# Patient Record
Sex: Male | Born: 1985 | Hispanic: No | Marital: Married | Smoking: Current every day smoker
Health system: Southern US, Community
[De-identification: ages and names within clinical notes are randomized; demographics above are authoritative.]

---

## 2016-04-06 ENCOUNTER — Encounter (HOSPITAL_BASED_OUTPATIENT_CLINIC_OR_DEPARTMENT_OTHER): Payer: Self-pay

## 2016-04-06 ENCOUNTER — Emergency Department (HOSPITAL_BASED_OUTPATIENT_CLINIC_OR_DEPARTMENT_OTHER)
Admission: EM | Admit: 2016-04-06 | Discharge: 2016-04-06 | Disposition: A | Discharge status: Home / Self Care | Payer: Medicaid Other | Attending: Emergency Medicine | Admitting: Emergency Medicine

## 2016-04-06 DIAGNOSIS — Y9389 Activity, other specified: Secondary | ICD-10-CM | POA: Insufficient documentation

## 2016-04-06 DIAGNOSIS — Y990 Civilian activity done for income or pay: Secondary | ICD-10-CM | POA: Diagnosis not present

## 2016-04-06 DIAGNOSIS — S0993XA Unspecified injury of face, initial encounter: Secondary | ICD-10-CM | POA: Diagnosis present

## 2016-04-06 DIAGNOSIS — S0181XA Laceration without foreign body of other part of head, initial encounter: Secondary | ICD-10-CM

## 2016-04-06 DIAGNOSIS — S01512A Laceration without foreign body of oral cavity, initial encounter: Secondary | ICD-10-CM | POA: Insufficient documentation

## 2016-04-06 DIAGNOSIS — Y929 Unspecified place or not applicable: Secondary | ICD-10-CM | POA: Diagnosis not present

## 2016-04-06 DIAGNOSIS — W460XXA Contact with hypodermic needle, initial encounter: Secondary | ICD-10-CM | POA: Diagnosis not present

## 2016-04-06 DIAGNOSIS — Z23 Encounter for immunization: Secondary | ICD-10-CM | POA: Diagnosis not present

## 2016-04-06 DIAGNOSIS — F17200 Nicotine dependence, unspecified, uncomplicated: Secondary | ICD-10-CM | POA: Insufficient documentation

## 2016-04-06 MED ORDER — TETANUS-DIPHTH-ACELL PERTUSSIS 5-2.5-18.5 LF-MCG/0.5 IM SUSP
0.5000 mL | Freq: Once | INTRAMUSCULAR | Status: AC
  Administered 2016-04-06: 0.5 mL via INTRAMUSCULAR

## 2016-04-06 MED ORDER — TETANUS-DIPHTH-ACELL PERTUSSIS 5-2.5-18.5 LF-MCG/0.5 IM SUSP
INTRAMUSCULAR | Status: AC
  Filled 2016-04-06: qty 0.5

## 2016-04-06 MED ORDER — TETANUS-DIPHTH-ACELL PERTUSSIS 5-2.5-18.5 LF-MCG/0.5 IM SUSP: 0.5000 mL | Freq: Once | INTRAMUSCULAR | Status: DC

## 2016-04-06 MED ORDER — AMOXICILLIN 500 MG PO CAPS: 500.0000 mg | ORAL_CAPSULE | Freq: Three times a day (TID) | ORAL | 0 refills | Status: DC

## 2016-04-06 NOTE — ED Triage Notes (Signed)
Thru interpreter line interpreter #[id number]/[place]ali-pt was bending over at work approx 830pm-larger swing needle punctured right cheek-puncture wound noted-no bleeding noted at this time-pt did not bring any paper work-states there is no one at the facility in the office at this hour-company name "[employer]"-no phone #-pt NAD-brother in law who speaks minimal english with pt

## 2016-04-06 NOTE — Discharge Instructions (Signed)
Return if any problems.

## 2016-04-06 NOTE — ED Provider Notes (Signed)
[facility]   CSN: [id number] Arrival date & time: 04/06/16  2100 By signing my name below, I, Bridgette HabermannMaria Tan, attest that this documentation has been prepared under the direction and in the presence of Cheron SchaumannLeslie Andras Grunewald, [place]PA-C. Electronically Signed: Bridgette HabermannMaria Tan, ED Scribe. 04/06/16. 10:55 PM.  History   Chief Complaint Chief Complaint  Patient presents with  . Facial Injury   The history is provided by the patient. The history is limited by a language barrier. A language interpreter was used.   HPI Comments: Bradley Lyons is a 30 y.o. male with no pertinent PMHx, who presents to the Emergency Department complaining of a puncture wound to right cheek s/p mechanical injury ~8:30 pm today. Pt states he was at work, bent over when a large swing needle punctured his right cheek. No LOC. Bleeding controlled. Pt has not tried any OTC medications PTA. Unsure if Tdap is UTD. PT denies fever, chills, or any other associated symptoms.  History reviewed. No pertinent past medical history.  There are no active problems to display for this patient.   History reviewed. No pertinent surgical history.     Home Medications    Prior to Admission medications   Not on File    Family History No family history on file.  Social History Social History  Substance Use Topics  . Smoking status: Current Every Day Smoker  . Smokeless tobacco: Never Used  . Alcohol use Yes     Comment: daily     Allergies   Patient has no known allergies.   Review of Systems Review of Systems  Constitutional: Negative for chills and fever.  Skin: Positive for wound.  All other systems reviewed and are negative.    Physical Exam Updated Vital Signs BP 132/74 (BP Location: Left Arm)   Pulse 65   Temp 98.2 F (36.8 C) (Oral)   Resp 18   Wt 109 lb 12.8 oz (49.8 kg)   SpO2 99%   Physical Exam  Constitutional: He appears well-developed and well-nourished.  HENT:  Head:  Normocephalic.  1 3 mm laceration to corner of his right mouth. 4 mm laceration inside his right mouth, slight gaping.  Eyes: Conjunctivae are normal.  Cardiovascular: Normal rate.   Pulmonary/Chest: Effort normal. No respiratory distress.  Abdominal: He exhibits no distension.  Musculoskeletal: Normal range of motion.  Neurological: He is alert.  Skin: Skin is warm and dry.  Psychiatric: He has a normal mood and affect. His behavior is normal.  Nursing note and vitals reviewed.    ED Treatments / Results  DIAGNOSTIC STUDIES: Oxygen Saturation is 99% on RA, normal by my interpretation.    COORDINATION OF CARE: 10:53 PM Discussed treatment plan with pt at bedside which includes wound care and pt agreed to plan.  Labs (all labs ordered are listed, but only abnormal results are displayed) Labs Reviewed - No data to display  EKG  EKG Interpretation None       Radiology No results found.  Procedures .Marland Kitchen.Laceration Repair Date/Time: 04/06/2016 11:34 PM Performed by: Elson AreasSOFIA, Elner Seifert K Authorized by: Elson AreasSOFIA, Elianie Hubers K   Consent:    Consent obtained:  Verbal   Consent given by:  Patient Anesthesia (see MAR for exact dosages):    Anesthesia method:  None Skin repair:    Repair method:  Tissue adhesive Approximation:    Approximation:  Close   (including critical care time)  Medications Ordered in ED Medications  Tdap (BOOSTRIX) injection 0.5 mL (0.5  mLs Intramuscular Given 04/06/16 2140)     Initial Impression / Assessment and Plan / ED Course  I have reviewed the triage vital signs and the nursing notes.  Pertinent labs & imaging results that were available during my care of the patient were reviewed by me and considered in my medical decision making (see chart for details).  Clinical Course       Final Clinical Impressions(s) / ED Diagnoses   Final diagnoses:  Facial laceration, initial encounter    New Prescriptions New Prescriptions   No medications on  file   No outpatient prescriptions have been marked as taking for the 04/06/16 encounter [facility](Hospital Encounter).      Lonia SkinnerLeslie K ElbeSofia, PA-C 04/06/16 [phone]    Pricilla LovelessScott Goldston, MD 04/09/16 [phone]

## 2018-04-16 ENCOUNTER — Encounter (HOSPITAL_BASED_OUTPATIENT_CLINIC_OR_DEPARTMENT_OTHER): Payer: Self-pay

## 2018-04-16 ENCOUNTER — Other Ambulatory Visit: Payer: Self-pay

## 2018-04-16 ENCOUNTER — Emergency Department (HOSPITAL_BASED_OUTPATIENT_CLINIC_OR_DEPARTMENT_OTHER)
Admission: EM | Admit: 2018-04-16 | Discharge: 2018-04-17 | Disposition: A | Discharge status: Home / Self Care | Payer: Self-pay | Attending: Emergency Medicine | Admitting: Emergency Medicine

## 2018-04-16 DIAGNOSIS — G8929 Other chronic pain: Secondary | ICD-10-CM | POA: Insufficient documentation

## 2018-04-16 DIAGNOSIS — M545 Low back pain, unspecified: Secondary | ICD-10-CM

## 2018-04-16 DIAGNOSIS — F17210 Nicotine dependence, cigarettes, uncomplicated: Secondary | ICD-10-CM | POA: Insufficient documentation

## 2018-04-16 LAB — URINALYSIS, ROUTINE W REFLEX MICROSCOPIC
BILIRUBIN URINE: NEGATIVE
Glucose, UA: NEGATIVE mg/dL
HGB URINE DIPSTICK: NEGATIVE
KETONES UR: NEGATIVE mg/dL
Leukocytes, UA: NEGATIVE
Nitrite: NEGATIVE
PH: 5 (ref 5.0–8.0)
Protein, ur: NEGATIVE mg/dL
SPECIFIC GRAVITY, URINE: 1.02 (ref 1.005–1.030)

## 2018-04-16 NOTE — ED Triage Notes (Signed)
Pt c/o lower back pain x 3-6 months-denies injury-states pain worse today-speaks some english-adult male/interpreter with pt-pt NAD-steady gait

## 2018-04-17 ENCOUNTER — Emergency Department (HOSPITAL_BASED_OUTPATIENT_CLINIC_OR_DEPARTMENT_OTHER): Payer: Self-pay

## 2018-04-17 MED ORDER — NAPROXEN 250 MG PO TABS
500.0000 mg | ORAL_TABLET | Freq: Once | ORAL | Status: AC
Start: 2018-04-17 — End: 2018-04-17
  Administered 2018-04-17: 500 mg via ORAL
  Filled 2018-04-17: qty 2

## 2018-04-17 MED ORDER — NAPROXEN 375 MG PO TABS: ORAL_TABLET | ORAL | 0 refills | Status: AC

## 2018-04-17 NOTE — ED Provider Notes (Addendum)
[facility] Provider Note: Lowella Dell, MD, FACEP  CSN: [id number] MRN: [id number] ARRIVAL: 04/16/18 at 2215 ROOM: MH09/[facility]09   CHIEF COMPLAINT  Back Pain   HISTORY OF PRESENT ILLNESS  04/17/18 12:08 AM Bradley Lyons is a 33 y.o. male with about a 30-month history of mid lumbar pain.  The pain acutely worsened today.  There was no injury or lifting or other cause for it to worsen.  He denies bowel or bladder changes.  There is no numbness or weakness associated.  He is rating his pain is moderate to severe.  It is worse with movement of his lower back but not with movement of his legs.  He has not taken anything for his pain.   History reviewed. No pertinent past medical history.  History reviewed. No pertinent surgical history.  No family history on file.  Social History   Tobacco Use  . Smoking status: Current Every Day Smoker  . Smokeless tobacco: Never Used  Substance Use Topics  . Alcohol use: Yes    Comment: daily  . Drug use: No    Prior to Admission medications   Medication Sig Start Date End Date Taking? Authorizing Provider  amoxicillin (AMOXIL) 500 MG capsule Take 1 capsule (500 mg total) by mouth 3 (three) times daily. 04/06/16   Elson Areas, PA-C    Allergies Patient has no known allergies.   REVIEW OF SYSTEMS  Negative except as noted here or in the History of Present Illness.   PHYSICAL EXAMINATION  Initial Vital Signs Blood pressure (!) 117/94, pulse 97, temperature 98.2 F (36.8 C), temperature source Oral, resp. rate 18, height 5\' 2"  (1.575 m), weight 50.8 kg, SpO2 97 %.  Examination General: Well-developed, well-nourished male in no acute distress; appearance consistent with age of record HENT: normocephalic; atraumatic Eyes: Normal appearance Neck: supple Heart: regular rate and rhythm Lungs: clear to auscultation bilaterally Abdomen: soft; nondistended; nontender; bowel sounds present Back: Nontender; lumbar pain on  movement of lower back; negative straight leg raise bilaterally Extremities: No deformity; full range of motion; pulses normal Neurologic: Awake, alert; motor function intact in all extremities and symmetric; sensation intact in lower extremities and symmetric; no facial droop Skin: Warm and dry Psychiatric: Normal mood and affect   RESULTS  Summary of this visit's results, reviewed by myself:   EKG Interpretation  Date/Time:    Ventricular Rate:    PR Interval:    QRS Duration:   QT Interval:    QTC Calculation:   R Axis:     Text Interpretation:        Laboratory Studies: Results for orders placed or performed during the hospital encounter of 04/16/18 (from the past 24 hour(s))  Urinalysis, Routine w reflex microscopic     Status: None   Collection Time: 04/16/18 11:32 PM  Result Value Ref Range   Color, Urine YELLOW YELLOW   APPearance CLEAR CLEAR   Specific Gravity, Urine 1.020 1.005 - 1.030   pH 5.0 5.0 - 8.0   Glucose, UA NEGATIVE NEGATIVE mg/dL   Hgb urine dipstick NEGATIVE NEGATIVE   Bilirubin Urine NEGATIVE NEGATIVE   Ketones, ur NEGATIVE NEGATIVE mg/dL   Protein, ur NEGATIVE NEGATIVE mg/dL   Nitrite NEGATIVE NEGATIVE   Leukocytes, UA NEGATIVE NEGATIVE   Imaging Studies: Dg Lumbar Spine Complete  Result Date: 04/17/2018 CLINICAL DATA:  Chronic lower back pain, worsening today. EXAM: LUMBAR SPINE - COMPLETE 4+ VIEW COMPARISON:  None. FINDINGS: There is no evidence of  fracture or subluxation. Vertebral bodies demonstrate normal height and alignment. Intervertebral disc spaces are preserved. The visualized neural foramina are grossly unremarkable in appearance. The visualized bowel gas pattern is unremarkable in appearance; air and stool are noted within the colon. The sacroiliac joints are within normal limits. IMPRESSION: No evidence of fracture or subluxation along the lumbar spine. Electronically Signed   By: Roanna Raider M.D.   On: 04/17/2018 01:08    ED  COURSE and MDM  Nursing notes and initial vitals signs, including pulse oximetry, reviewed.  Vitals:   04/16/18 2223  BP: (!) 117/94  Pulse: 97  Resp: 18  Temp: 98.2 F (36.8 C)  TempSrc: Oral  SpO2: 97%  Weight: 50.8 kg  Height: 5\' 2"  (1.575 m)    PROCEDURES    ED DIAGNOSES     ICD-10-CM   1. Chronic midline low back pain without sciatica M54.5    G89.29        Cara Aguino, Jonny Ruiz, MD 04/17/18 0112    Paula Libra, MD 04/17/18 [id number]

## 2018-04-17 NOTE — ED Notes (Signed)
Patient transported to X-ray 

## 2018-04-17 NOTE — ED Notes (Signed)
ED Provider at bedside. 

## 2018-04-21 ENCOUNTER — Ambulatory Visit: Payer: Self-pay | Admitting: Family Medicine

## 2020-03-25 ENCOUNTER — Ambulatory Visit: Payer: Medicaid Other | Admitting: Medical

## 2020-03-28 ENCOUNTER — Ambulatory Visit: Payer: Medicaid Other | Admitting: Medical

## 2020-06-07 ENCOUNTER — Ambulatory Visit: Payer: Medicaid Other | Admitting: Medical

## 2020-06-17 IMAGING — DX DG LUMBAR SPINE COMPLETE 4+V
7 series · 7 of 7 positions shown · non-contrast
Comparison: None.

CLINICAL DATA: Chronic lower back pain, worsening today.

EXAM:
LUMBAR SPINE - COMPLETE 4+ VIEW

[l-spine ap]
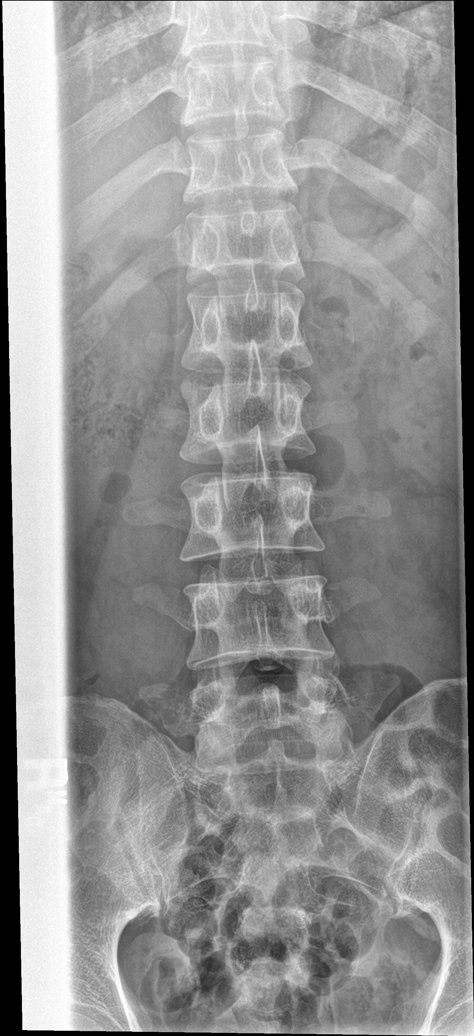

[l-spine obl (1 of 3)]
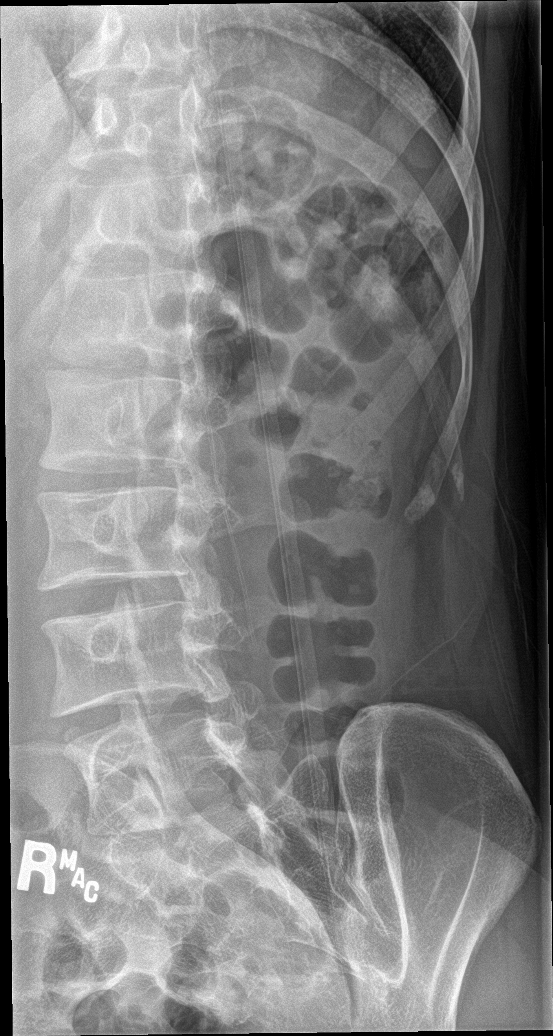

[l-spine obl (2 of 3)]
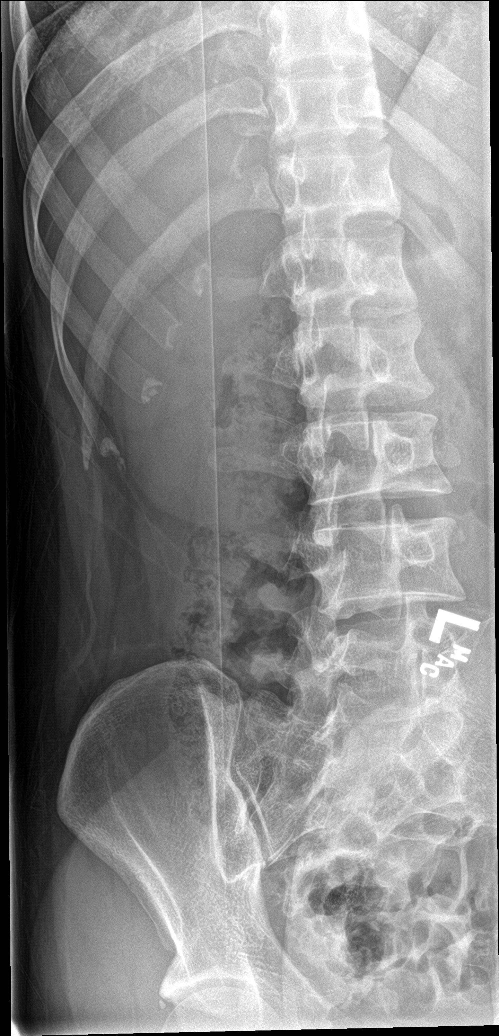

[l-spine lat (1 of 2)]
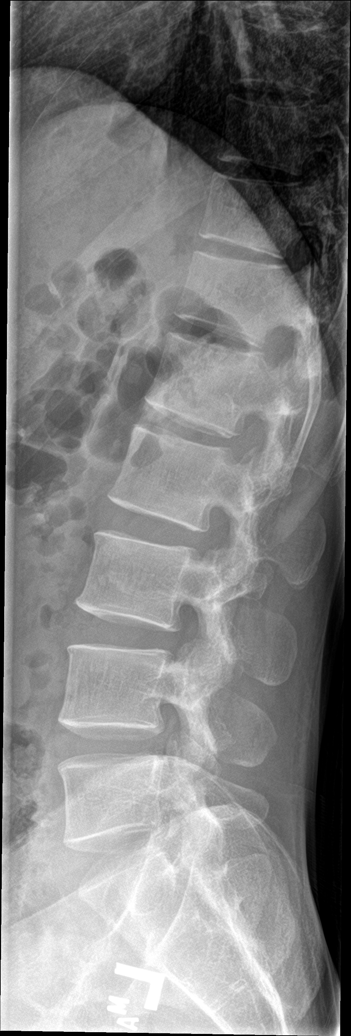

[l-spine spot]
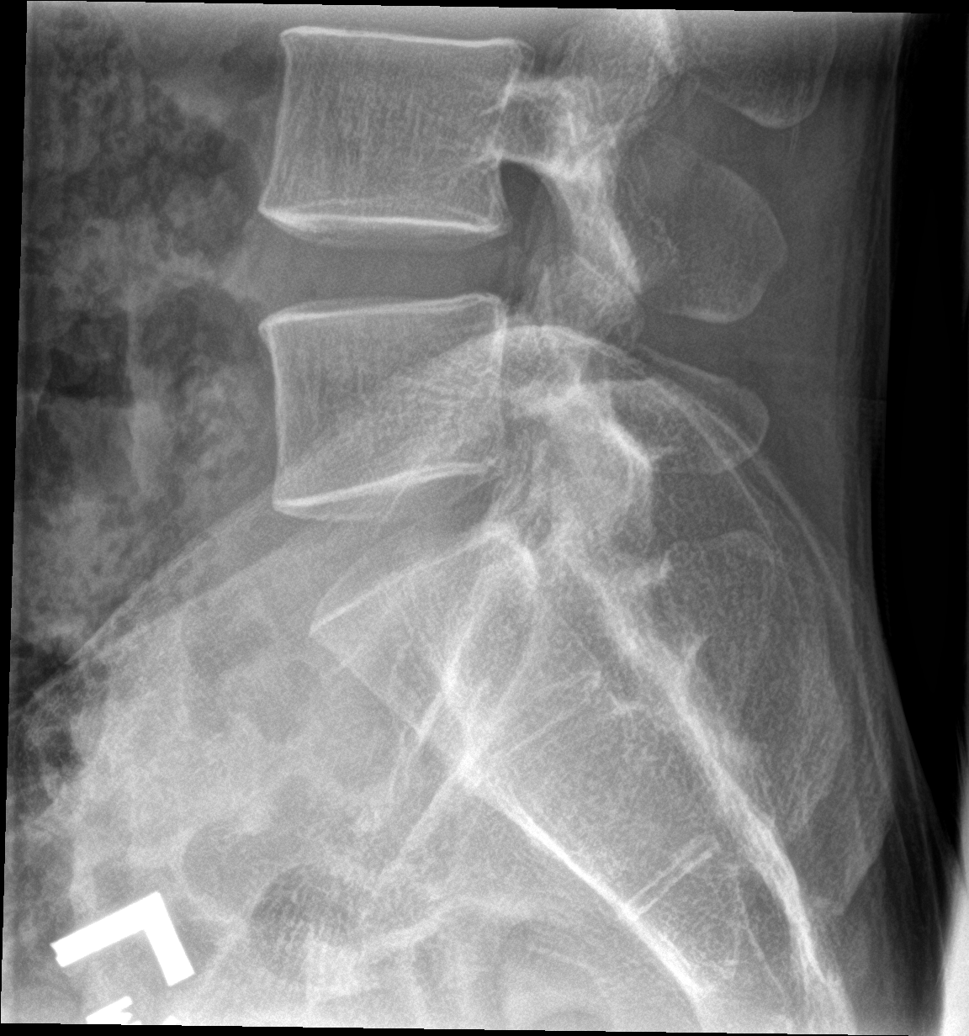

[l-spine obl (3 of 3)]
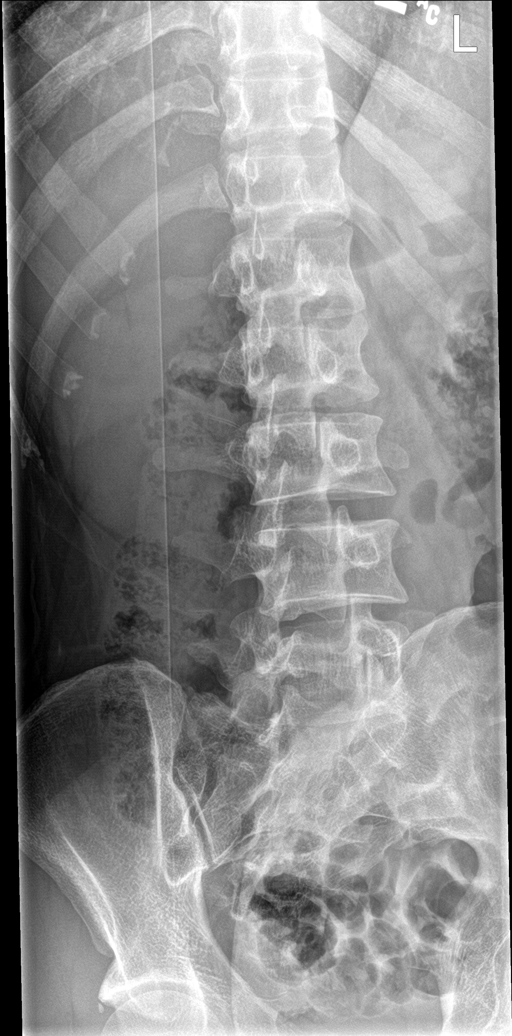

[l-spine lat (2 of 2)]
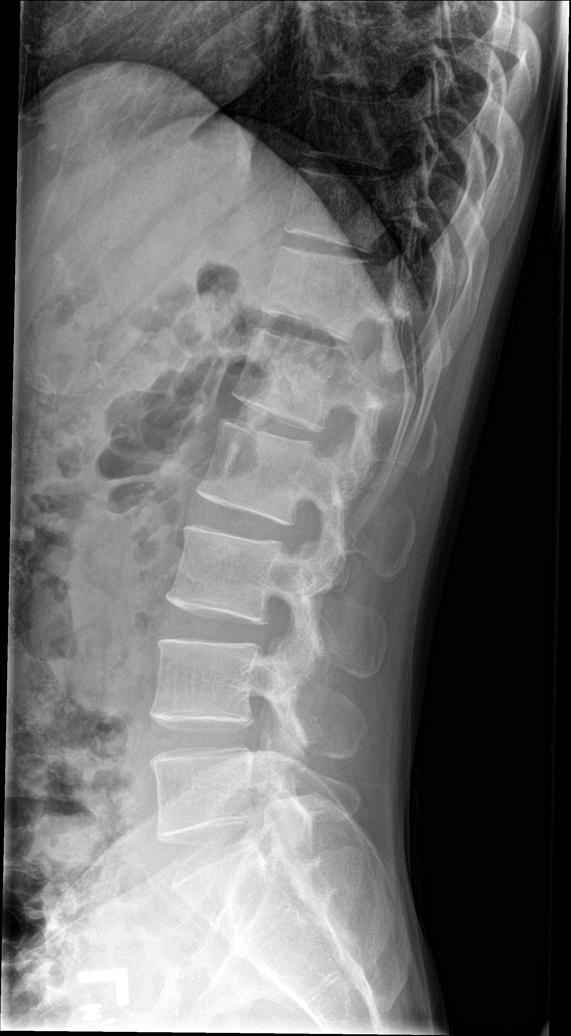

[7 of 7 positions shown; findings below may reference images not displayed]

FINDINGS: There is no evidence of fracture or subluxation. Vertebral bodies
demonstrate normal height and alignment. Intervertebral disc spaces
are preserved. The visualized neural foramina are grossly
unremarkable in appearance.

The visualized bowel gas pattern is unremarkable in appearance; air
and stool are noted within the colon. The sacroiliac joints are
within normal limits.
IMPRESSION: No evidence of fracture or subluxation along the lumbar spine.
